# Patient Record
Sex: Male | Born: 1995 | Race: Black or African American | Hispanic: No | Marital: Single
Health system: Southern US, Community
[De-identification: ages and names within clinical notes are randomized; demographics above are authoritative.]

---

## 2015-10-17 ENCOUNTER — Emergency Department (HOSPITAL_COMMUNITY): Payer: No Typology Code available for payment source

## 2015-10-17 ENCOUNTER — Encounter (HOSPITAL_COMMUNITY): Payer: Self-pay | Admitting: Radiology

## 2015-10-17 ENCOUNTER — Emergency Department (HOSPITAL_COMMUNITY)
Admission: EM | Admit: 2015-10-17 | Discharge: 2015-10-17 | Disposition: A | Discharge status: Home / Self Care | Payer: No Typology Code available for payment source | Attending: Emergency Medicine | Admitting: Emergency Medicine

## 2015-10-17 DIAGNOSIS — S060X1A Concussion with loss of consciousness of 30 minutes or less, initial encounter: Secondary | ICD-10-CM | POA: Insufficient documentation

## 2015-10-17 DIAGNOSIS — S8991XA Unspecified injury of right lower leg, initial encounter: Secondary | ICD-10-CM | POA: Insufficient documentation

## 2015-10-17 DIAGNOSIS — Y92410 Unspecified street and highway as the place of occurrence of the external cause: Secondary | ICD-10-CM | POA: Diagnosis not present

## 2015-10-17 DIAGNOSIS — Y998 Other external cause status: Secondary | ICD-10-CM | POA: Diagnosis not present

## 2015-10-17 DIAGNOSIS — S8992XA Unspecified injury of left lower leg, initial encounter: Secondary | ICD-10-CM | POA: Insufficient documentation

## 2015-10-17 DIAGNOSIS — Y9389 Activity, other specified: Secondary | ICD-10-CM | POA: Diagnosis not present

## 2015-10-17 DIAGNOSIS — S0990XA Unspecified injury of head, initial encounter: Secondary | ICD-10-CM | POA: Diagnosis present

## 2015-10-17 LAB — PREPARE FRESH FROZEN PLASMA
UNIT DIVISION: 0
Unit division: 0

## 2015-10-17 LAB — COMPREHENSIVE METABOLIC PANEL
ALT: 17 U/L (ref 17–63)
AST: 28 U/L (ref 15–41)
Albumin: 4.4 g/dL (ref 3.5–5.0)
Alkaline Phosphatase: 66 U/L (ref 38–126)
Anion gap: 16 — ABNORMAL HIGH (ref 5–15)
BILIRUBIN TOTAL: 0.7 mg/dL (ref 0.3–1.2)
BUN: 16 mg/dL (ref 6–20)
CHLORIDE: 98 mmol/L — AB (ref 101–111)
CO2: 23 mmol/L (ref 22–32)
CREATININE: 1.18 mg/dL (ref 0.61–1.24)
Calcium: 9.9 mg/dL (ref 8.9–10.3)
Glucose, Bld: 64 mg/dL — ABNORMAL LOW (ref 65–99)
POTASSIUM: 4.2 mmol/L (ref 3.5–5.1)
Sodium: 137 mmol/L (ref 135–145)
TOTAL PROTEIN: 7.5 g/dL (ref 6.5–8.1)

## 2015-10-17 LAB — PROTIME-INR
INR: 1.05 (ref 0.00–1.49)
PROTHROMBIN TIME: 13.9 s (ref 11.6–15.2)

## 2015-10-17 LAB — ETHANOL: Alcohol, Ethyl (B): <5 mg/dL (ref <5)

## 2015-10-17 LAB — CBC
HCT: 45.2 % (ref 39.0–52.0)
Hemoglobin: 15.5 g/dL (ref 13.0–17.0)
MCH: 31.5 pg (ref 26.0–34.0)
MCHC: 34.3 g/dL (ref 30.0–36.0)
MCV: 91.9 fL (ref 78.0–100.0)
PLATELETS: 212 10*3/uL (ref 150–400)
RBC: 4.92 MIL/uL (ref 4.22–5.81)
RDW: 13.2 % (ref 11.5–15.5)
WBC: 10.5 10*3/uL (ref 4.0–10.5)

## 2015-10-17 LAB — BLOOD PRODUCT ORDER (VERBAL) VERIFICATION

## 2015-10-17 LAB — CDS SEROLOGY

## 2015-10-17 LAB — ABO/RH: ABO/RH(D): O POS

## 2015-10-17 MED ORDER — IBUPROFEN 800 MG PO TABS: 800.0000 mg | ORAL_TABLET | Freq: Three times a day (TID) | ORAL | Status: AC

## 2015-10-17 MED ORDER — SODIUM CHLORIDE 0.9 % IV SOLN
Freq: Once | INTRAVENOUS | Status: AC
  Administered 2015-10-17: 20:00:00 via INTRAVENOUS

## 2015-10-17 MED ORDER — IOHEXOL 300 MG/ML  SOLN
100.0000 mL | Freq: Once | INTRAMUSCULAR | Status: AC | PRN
  Administered 2015-10-17: 100 mL via INTRAVENOUS
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
## 2015-10-17 NOTE — ED Notes (Signed)
Lab at the bedside 
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
## 2015-10-18 LAB — TYPE AND SCREEN
ABO/RH(D): O POS
ANTIBODY SCREEN: NEGATIVE
UNIT DIVISION: 0
Unit division: 0
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
## 2021-10-30 ENCOUNTER — Emergency Department (HOSPITAL_COMMUNITY): Admission: EM | Admit: 2021-10-30 | Discharge: 2021-10-30 | Discharge status: Against Medical Advice | Payer: Self-pay

## 2021-10-30 NOTE — ED Notes (Signed)
Patient called x 1
